# Patient Record
Sex: Female | Born: 2000 | Race: White | Hispanic: No | Marital: Single | Smoking: Never smoker
Health system: Southern US, Community
[De-identification: ages and names within clinical notes are randomized; demographics above are authoritative.]

## PROBLEM LIST (undated history)

## (undated) DIAGNOSIS — T7840XA Allergy, unspecified, initial encounter: Secondary | ICD-10-CM

## (undated) DIAGNOSIS — F32A Depression, unspecified: Secondary | ICD-10-CM

## (undated) HISTORY — DX: Allergy, unspecified, initial encounter: T78.40XA

---
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
## 2020-05-13 ENCOUNTER — Ambulatory Visit (HOSPITAL_COMMUNITY)
Admission: EM | Admit: 2020-05-13 | Discharge: 2020-05-13 | Disposition: A | Discharge status: Home / Self Care | Payer: 59 | Attending: Family Medicine | Admitting: Family Medicine

## 2020-05-13 ENCOUNTER — Ambulatory Visit (HOSPITAL_COMMUNITY)
Admit: 2020-05-13 | Disposition: A | Discharge status: Home / Self Care | Payer: No Typology Code available for payment source

## 2020-05-13 ENCOUNTER — Encounter (HOSPITAL_COMMUNITY): Payer: Self-pay

## 2020-05-13 ENCOUNTER — Other Ambulatory Visit: Payer: Self-pay

## 2020-05-13 DIAGNOSIS — F988 Other specified behavioral and emotional disorders with onset usually occurring in childhood and adolescence: Secondary | ICD-10-CM | POA: Diagnosis not present

## 2020-05-13 DIAGNOSIS — J020 Streptococcal pharyngitis: Secondary | ICD-10-CM

## 2020-05-13 DIAGNOSIS — F329 Major depressive disorder, single episode, unspecified: Secondary | ICD-10-CM | POA: Diagnosis not present

## 2020-05-13 DIAGNOSIS — R5383 Other fatigue: Secondary | ICD-10-CM | POA: Diagnosis not present

## 2020-05-13 DIAGNOSIS — Z20822 Contact with and (suspected) exposure to covid-19: Secondary | ICD-10-CM | POA: Insufficient documentation

## 2020-05-13 DIAGNOSIS — Z793 Long term (current) use of hormonal contraceptives: Secondary | ICD-10-CM | POA: Diagnosis not present

## 2020-05-13 DIAGNOSIS — Z79899 Other long term (current) drug therapy: Secondary | ICD-10-CM | POA: Diagnosis not present

## 2020-05-13 DIAGNOSIS — J45909 Unspecified asthma, uncomplicated: Secondary | ICD-10-CM | POA: Insufficient documentation

## 2020-05-13 DIAGNOSIS — J029 Acute pharyngitis, unspecified: Secondary | ICD-10-CM

## 2020-05-13 DIAGNOSIS — R05 Cough: Secondary | ICD-10-CM | POA: Diagnosis present

## 2020-05-13 HISTORY — DX: Depression, unspecified: F32.A

## 2020-05-13 LAB — POCT RAPID STREP A, ED / UC: Streptococcus, Group A Screen (Direct): POSITIVE — AB

## 2020-05-13 LAB — SARS CORONAVIRUS 2 (TAT 6-24 HRS): SARS Coronavirus 2: NEGATIVE

## 2020-05-13 MED ORDER — AMOXICILLIN 500 MG PO CAPS: 500.0000 mg | ORAL_CAPSULE | Freq: Two times a day (BID) | ORAL | 0 refills | Status: AC

## 2020-05-13 NOTE — ED Provider Notes (Signed)
[facility]    CSN: [id number] Arrival date & time: 05/13/20  0940      History   Chief Complaint Chief Complaint  Patient presents with  . Sore Throat    HPI Wanda Harper is a 19 y.o. female.   Wanda Harper presents with complaints of sore throat, fatigue, body aches, mild congestion, which started yesterday. No known ill contacts. No cough.   ROS per HPI, negative if not otherwise mentioned.      Past Medical History:  Diagnosis Date  . Allergy   . Asthma   . Depression     Patient Active Problem List   Diagnosis Date Noted  . PMDD (premenstrual dysphoric disorder) 08/10/2016  . 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 Right knee injury 07/04/2015  . IBS (irritable bowel syndrome) 06/16/2015  . RHINITIS - ALLERGIC 04/02/2009    History reviewed. No pertinent surgical history.  OB History   No obstetric history on file.      Home Medications    Prior to Admission medications   Medication Sig Start Date End Date Taking? Authorizing Provider  sertraline (ZOLOFT) 100 MG tablet Take 100 mg by mouth daily.   Yes [provider]  albuterol (VENTOLIN HFA) 108 (90 Base) MCG/ACT inhaler Inhale 2 puffs into the lungs every 4 (four) hours as needed for wheezing or shortness of breath. 11/04/16   Donato Schultz, DO  amoxicillin (AMOXIL) 500 MG capsule Take 1 capsule (500 mg total) by mouth 2 (two) times daily for 10 days. 05/13/20 05/23/20  Georgetta Haber, NP  amphetamine-dextroamphetamine (ADDERALL XR) 20 MG 24 hr capsule Take 1 capsule (20 mg total) by mouth every morning. 11/04/16   Donato Schultz, DO  amphetamine-dextroamphetamine (ADDERALL XR) 20 MG 24 hr capsule Take 1 capsule (20 mg total) by mouth every morning. 11/04/16   Donato Schultz, DO  amphetamine-dextroamphetamine (ADDERALL XR) 20 MG 24 hr capsule Take 1 capsule (20 mg total) by mouth every morning. 11/04/16   Donato Schultz, DO    norethindrone-ethinyl estradiol (JUNEL FE,GILDESS FE,LOESTRIN FE) 1-20 MG-MCG tablet Take 1 tablet by mouth daily. 11/06/16   Donato Schultz, DO    Family History Family History  Problem Relation Age of Onset  . Hypertension Father   . Coronary artery disease Paternal Grandmother   . Heart disease Paternal Grandmother        MI  . Diabetes Paternal Grandmother   . Heart disease Maternal Uncle     Social History Social History   Tobacco Use  . Smoking status: Never Smoker  . Smokeless tobacco: Never Used  Substance Use Topics  . Alcohol use: Yes    Comment: occ  . Drug use: No     Allergies   Patient has no known allergies.   Review of Systems Review of Systems   Physical Exam Triage Vital Signs ED Triage Vitals  Enc Vitals Group     BP 05/13/20 1046 127/79     Pulse Rate 05/13/20 1046 78     Resp 05/13/20 1046 16     Temp 05/13/20 1046 (!) 100.8 F (38.2 C)     Temp Source 05/13/20 1046 Oral     SpO2 05/13/20 1046 99 %     Weight 05/13/20 1047 190 lb (86.2 kg)     Height 05/13/20 1047 5\' 10"  (1.778 m)     Head Circumference --  Peak Flow --      Pain Score 05/13/20 1047 10     Pain Loc --      Pain Edu? --      Excl. in GC? --    No data found.  Updated Vital Signs BP 127/79   Pulse 78   Temp (!) 100.8 F (38.2 C) (Oral)   Resp 16   Ht 5\' 10"  (1.778 m)   Wt 190 lb (86.2 kg)   SpO2 99%   BMI 27.26 kg/m   Visual Acuity Right Eye Distance:   Left Eye Distance:   Bilateral Distance:    Right Eye Near:   Left Eye Near:    Bilateral Near:     Physical Exam Constitutional:      General: She is not in acute distress.    Appearance: She is well-developed.  HENT:     Mouth/Throat:     Pharynx: Uvula midline.     Tonsils: No tonsillar exudate. 1+ on the right. 1+ on the left.     Comments: Palatal petechia noted  Cardiovascular:     Rate and Rhythm: Normal rate.  Pulmonary:     Effort: Pulmonary effort is normal.  Skin:     General: Skin is warm and dry.  Neurological:     Mental Status: She is alert and oriented to person, place, and time.      UC Treatments / Results  Labs (all labs ordered are listed, but only abnormal results are displayed) Labs Reviewed  POCT RAPID STREP A, ED / UC - Abnormal; Notable for the following components:      Result Value   Streptococcus, Group A Screen (Direct) POSITIVE (*)    All other components within normal limits  SARS CORONAVIRUS 2 (TAT 6-24 HRS)    EKG   Radiology No results found.  Procedures Procedures (including critical care time)  Medications Ordered in UC Medications - No data to display  Initial Impression / Assessment and Plan / UC Course  I have reviewed the triage vital signs and the nursing notes.  Pertinent labs & imaging results that were available during my care of the patient were reviewed by me and considered in my medical decision making (see chart for details).     Positive rapid strep here today with treatment provided. covid testing pending. Return precautions provided. Patient verbalized understanding and agreeable to plan.   Final Clinical Impressions(s) / UC Diagnoses   Final diagnoses:  Strep pharyngitis     Discharge Instructions     Your strep test is positive today. Complete course of antibiotics.  Throat lozenges, gargles, chloraseptic spray, warm teas, popsicles etc to help with throat pain.   Change out toothbrush in 24 hours.   Considered contagious for the next 24 hours starting from first dose of antibiotics.  If symptoms worsen or do not improve in the next week to return to be seen or to follow up with your PCP.     ED Prescriptions    Medication Sig Dispense Auth. Provider   amoxicillin (AMOXIL) 500 MG capsule Take 1 capsule (500 mg total) by mouth 2 (two) times daily for 10 days. 20 capsule , NP     PDMP not reviewed this encounter.   Georgetta Haber, NP 05/13/20 1117

## 2020-05-13 NOTE — ED Triage Notes (Signed)
Pt c/o sore throat and dysphagia since yesterday. Pt denies any other sx.

## 2020-05-13 NOTE — Discharge Instructions (Signed)
Your strep test is positive today. Complete course of antibiotics.  Throat lozenges, gargles, chloraseptic spray, warm teas, popsicles etc to help with throat pain.   Change out toothbrush in 24 hours.   Considered contagious for the next 24 hours starting from first dose of antibiotics.  If symptoms worsen or do not improve in the next week to return to be seen or to follow up with your PCP.

## 2020-06-04 ENCOUNTER — Telehealth: Payer: Self-pay | Admitting: Family Medicine

## 2020-06-04 NOTE — Telephone Encounter (Signed)
Patient would like to re-establish care with you.   Please Advise  

## 2020-06-08 NOTE — Telephone Encounter (Signed)
[facility] is not taking new patients at this time

## 2022-08-18 DIAGNOSIS — Z6824 Body mass index (BMI) 24.0-24.9, adult: Secondary | ICD-10-CM | POA: Diagnosis not present

## 2022-08-18 DIAGNOSIS — H66003 Acute suppurative otitis media without spontaneous rupture of ear drum, bilateral: Secondary | ICD-10-CM | POA: Diagnosis not present

## 2022-08-18 DIAGNOSIS — J4520 Mild intermittent asthma, uncomplicated: Secondary | ICD-10-CM | POA: Diagnosis not present

## 2023-01-31 DIAGNOSIS — N390 Urinary tract infection, site not specified: Secondary | ICD-10-CM | POA: Diagnosis not present

## 2023-03-31 DIAGNOSIS — F4322 Adjustment disorder with anxiety: Secondary | ICD-10-CM | POA: Diagnosis not present

## 2023-04-03 DIAGNOSIS — Z3009 Encounter for other general counseling and advice on contraception: Secondary | ICD-10-CM | POA: Diagnosis not present

## 2023-04-03 DIAGNOSIS — K120 Recurrent oral aphthae: Secondary | ICD-10-CM | POA: Diagnosis not present

## 2023-04-03 DIAGNOSIS — F902 Attention-deficit hyperactivity disorder, combined type: Secondary | ICD-10-CM | POA: Diagnosis not present

## 2023-04-07 DIAGNOSIS — F4322 Adjustment disorder with anxiety: Secondary | ICD-10-CM | POA: Diagnosis not present

## 2023-05-02 DIAGNOSIS — Z111 Encounter for screening for respiratory tuberculosis: Secondary | ICD-10-CM | POA: Diagnosis not present

## 2023-05-09 DIAGNOSIS — F4322 Adjustment disorder with anxiety: Secondary | ICD-10-CM | POA: Diagnosis not present

## 2023-05-16 DIAGNOSIS — F4322 Adjustment disorder with anxiety: Secondary | ICD-10-CM | POA: Diagnosis not present

## 2023-05-23 DIAGNOSIS — F4322 Adjustment disorder with anxiety: Secondary | ICD-10-CM | POA: Diagnosis not present

## 2023-05-31 DIAGNOSIS — Z6823 Body mass index (BMI) 23.0-23.9, adult: Secondary | ICD-10-CM | POA: Diagnosis not present

## 2023-05-31 DIAGNOSIS — Z124 Encounter for screening for malignant neoplasm of cervix: Secondary | ICD-10-CM | POA: Diagnosis not present

## 2023-05-31 DIAGNOSIS — Z309 Encounter for contraceptive management, unspecified: Secondary | ICD-10-CM | POA: Diagnosis not present

## 2023-05-31 DIAGNOSIS — Z01419 Encounter for gynecological examination (general) (routine) without abnormal findings: Secondary | ICD-10-CM | POA: Diagnosis not present

## 2023-05-31 DIAGNOSIS — Z113 Encounter for screening for infections with a predominantly sexual mode of transmission: Secondary | ICD-10-CM | POA: Diagnosis not present

## 2023-06-13 DIAGNOSIS — Z30430 Encounter for insertion of intrauterine contraceptive device: Secondary | ICD-10-CM | POA: Diagnosis not present

## 2023-06-13 DIAGNOSIS — Z3202 Encounter for pregnancy test, result negative: Secondary | ICD-10-CM | POA: Diagnosis not present

## 2023-07-09 DIAGNOSIS — H1033 Unspecified acute conjunctivitis, bilateral: Secondary | ICD-10-CM | POA: Diagnosis not present

## 2023-07-09 DIAGNOSIS — J069 Acute upper respiratory infection, unspecified: Secondary | ICD-10-CM | POA: Diagnosis not present

## 2023-07-09 DIAGNOSIS — U071 COVID-19: Secondary | ICD-10-CM | POA: Diagnosis not present

## 2023-08-01 DIAGNOSIS — Z30431 Encounter for routine checking of intrauterine contraceptive device: Secondary | ICD-10-CM | POA: Diagnosis not present

## 2023-08-11 DIAGNOSIS — F902 Attention-deficit hyperactivity disorder, combined type: Secondary | ICD-10-CM | POA: Diagnosis not present

## 2023-08-11 DIAGNOSIS — F909 Attention-deficit hyperactivity disorder, unspecified type: Secondary | ICD-10-CM | POA: Diagnosis not present
